# Patient Record
Sex: Female | Born: 1935 | Race: Black or African American | Hispanic: No
Health system: Southern US, Community
[De-identification: ages and names within clinical notes are randomized; demographics above are authoritative.]

---

## 2008-03-09 ENCOUNTER — Inpatient Hospital Stay: Payer: Self-pay | Admitting: Internal Medicine

## 2008-10-13 ENCOUNTER — Ambulatory Visit: Payer: Self-pay

## 2009-08-02 ENCOUNTER — Ambulatory Visit: Payer: Self-pay

## 2009-08-25 ENCOUNTER — Emergency Department: Payer: Self-pay | Admitting: Emergency Medicine

## 2009-09-17 ENCOUNTER — Emergency Department: Payer: Self-pay | Admitting: Emergency Medicine

## 2009-10-05 ENCOUNTER — Ambulatory Visit: Payer: Self-pay | Admitting: Ophthalmology

## 2009-10-12 ENCOUNTER — Ambulatory Visit: Payer: Self-pay | Admitting: Ophthalmology

## 2010-01-07 ENCOUNTER — Ambulatory Visit: Payer: Self-pay | Admitting: Ophthalmology

## 2010-02-26 IMAGING — US US PELV - US TRANSVAGINAL
1 series · 17 of 25 positions shown · non-contrast
Comparison: none

REASON FOR EXAM: Left Adnexal Mass
COMMENTS:

[Series 1: us pelv - us transvaginal · 17 of 34 slices shown]
[im 1/34]
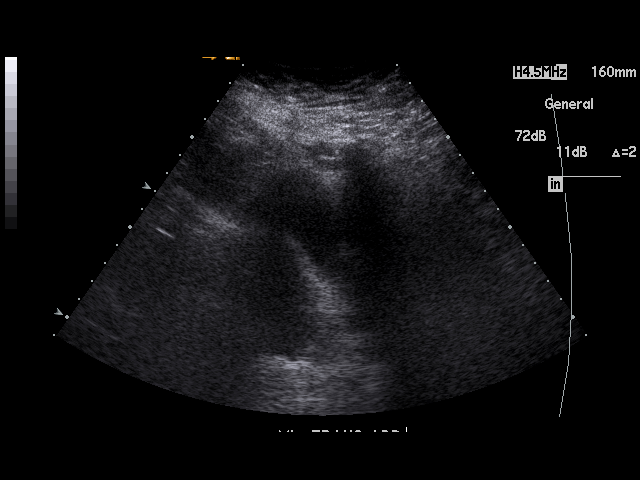
[im 3/34]
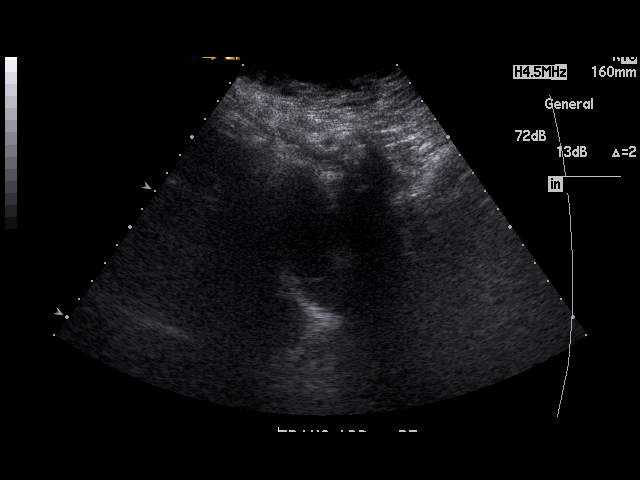
[im 5/34]
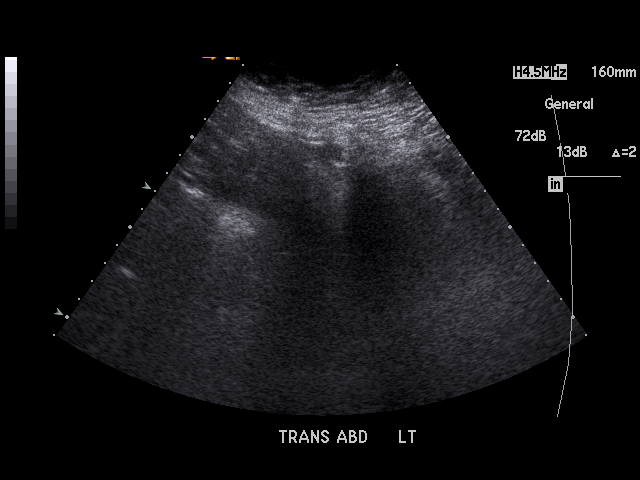
[im 7/34]
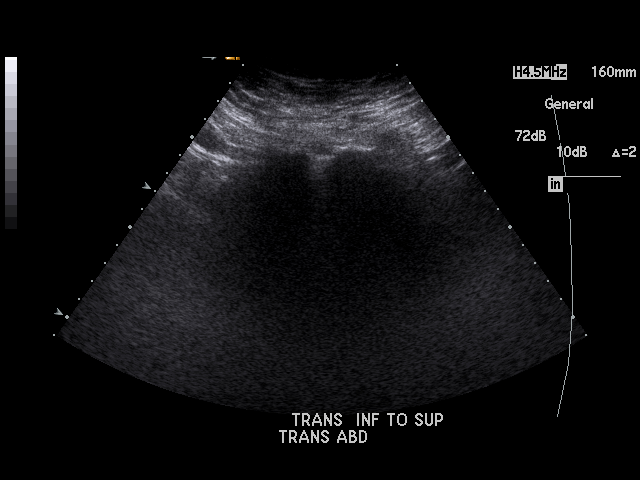
[im 9/34]
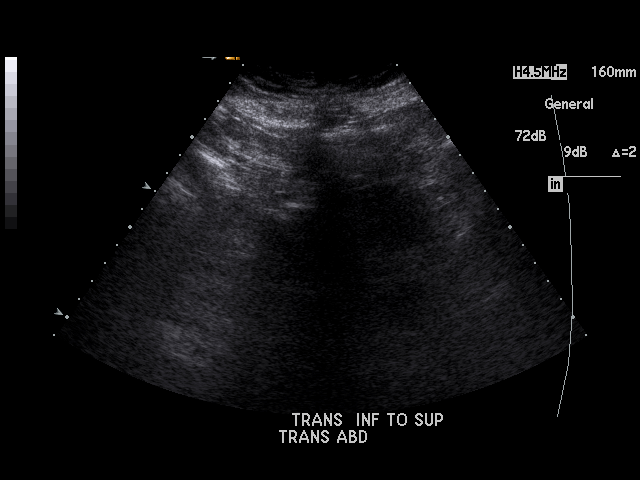
[im 12/34]
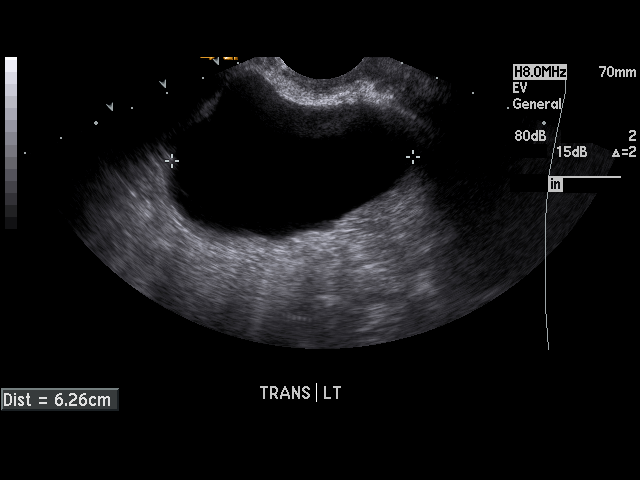
[im 13/34]
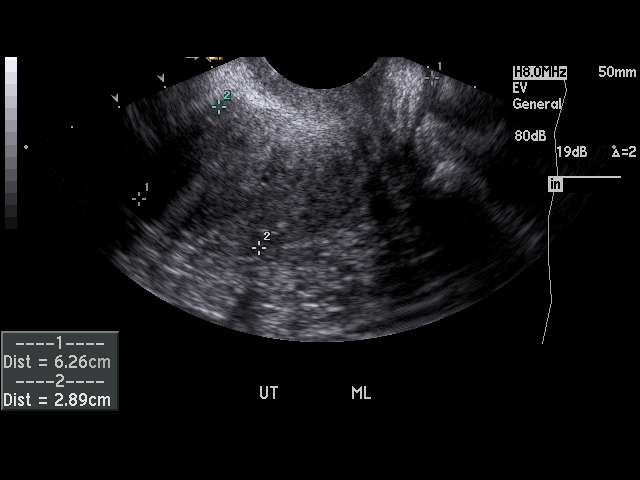
[im 16/34]
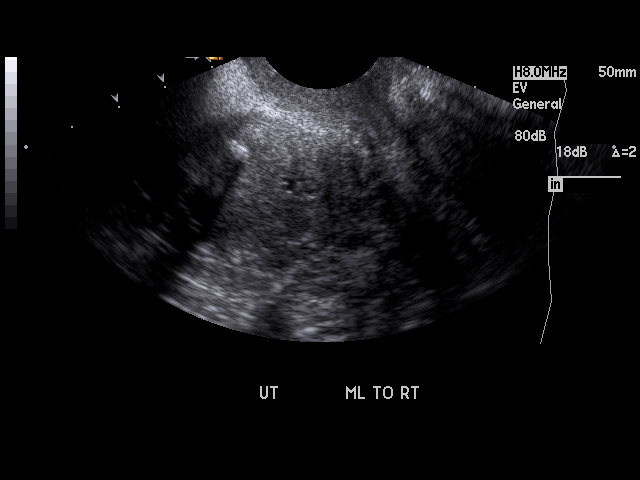
[im 17/34]
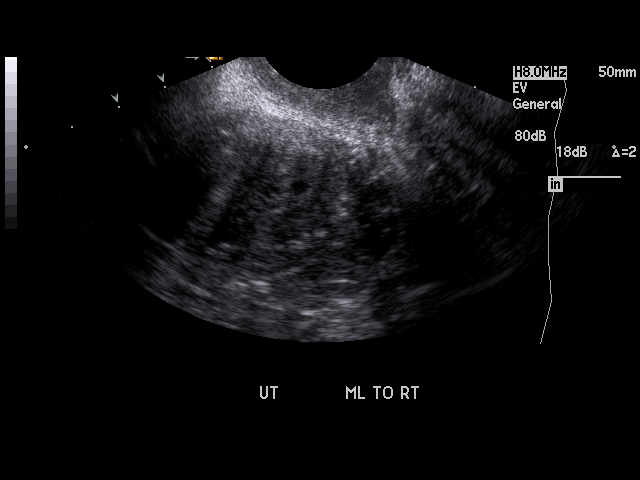
[im 18/34]
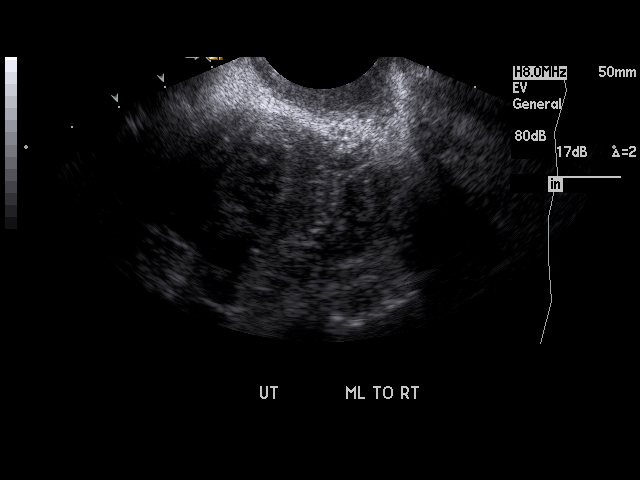
[im 21/34]
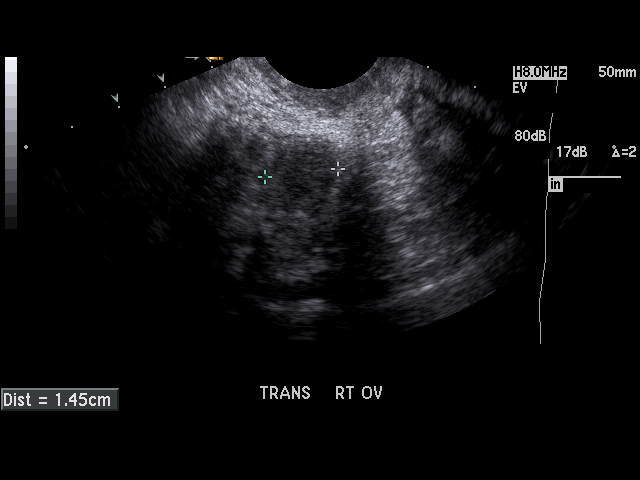
[im 23/34]
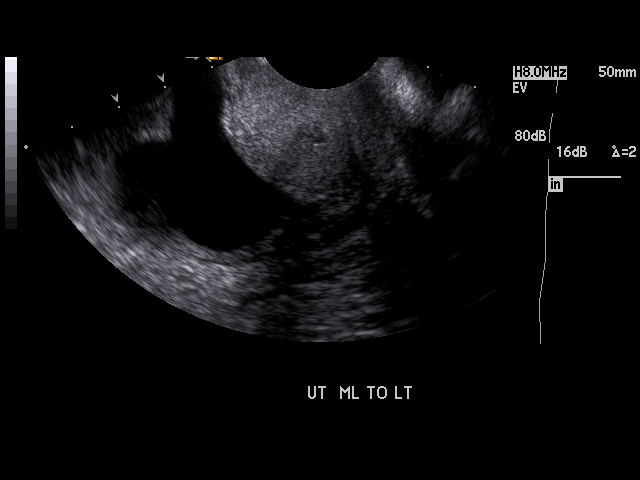
[im 25/34]
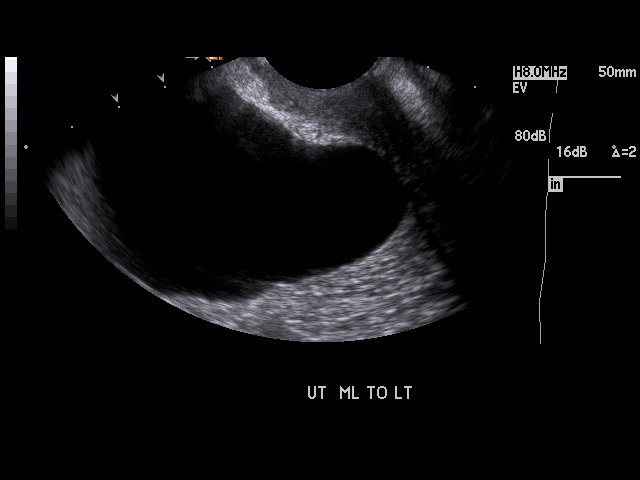
[im 27/34]
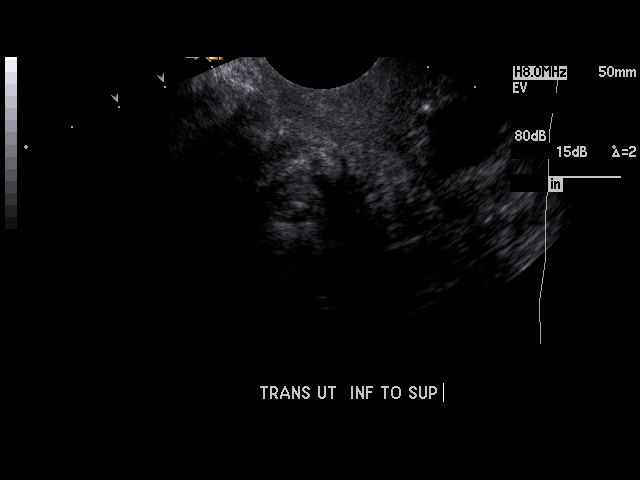
[im 29/34]
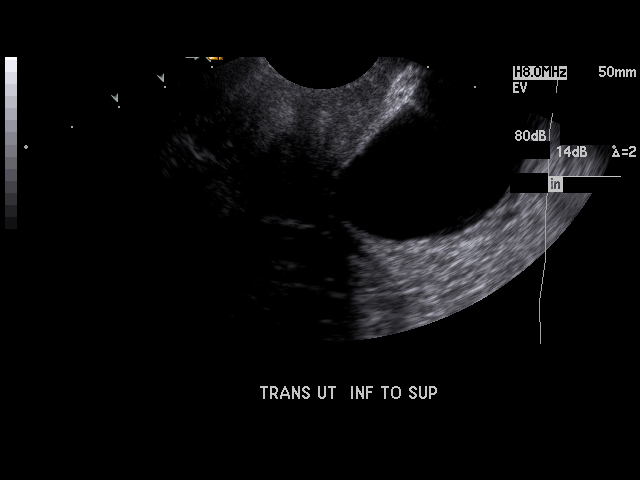
[im 31/34]
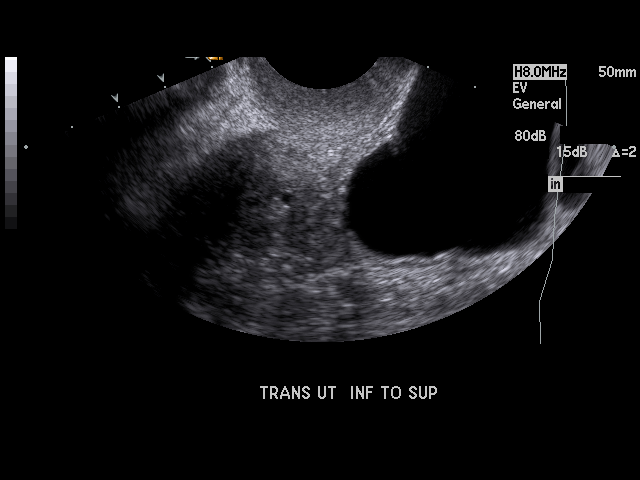
[im 34/34]
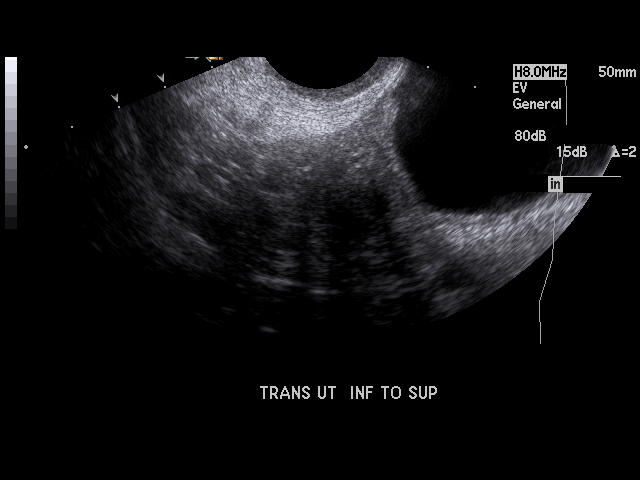

[17 of 25 positions shown; findings below may reference images not displayed]

PROCEDURE:     US  - US PELVIS MASS EXAM W/TRANSVAGI  - March 15, 2008 [DATE]

RESULT:     The uterus is slightly heterogenous with small calcific
densities noted.  Maximum diameter is 6.63 cm (longitudinal diameter).
Fibroid uterus cannot be excluded. The endometrium is normal.  There is
approximately 6.3 cm LEFT adnexal cyst. This is noted on prior CT. The cyst
appears cystic, however, given its large size gynecologic evaluation should
be considered.  The RIGHT ovary is unremarkable.  No free fluid is noted.
IMPRESSION: 6.3 cm LEFT adnexal cyst. This is most likely an ovarian cyst. The LEFT
ovary, however, is not visualized.  The RIGHT ovary is unremarkable.   It
should be noted that the cyst in the LEFT adnexa appears to be simple but
given its large size, close gynecological follow up or direct visualization
should be considered.

## 2010-02-27 IMAGING — CR DG CHEST 1V PORT
1 series · 1 of 1 positions shown · non-contrast
Comparison: none

REASON FOR EXAM: PNEUMONIA/CHF
COMMENTS:

PROCEDURE:     DXR - DXR PORTABLE CHEST SINGLE VIEW  - March 16, 2008  [DATE]
RESULT:     Bilateral patchy pulmonary infiltrates are noted. These have
cleared slightly from 03-10-2008.  The cardiovascular structures are
unremarkable.  Surgical clips are noted in the LEFT upper quadrant.

[view not recorded]
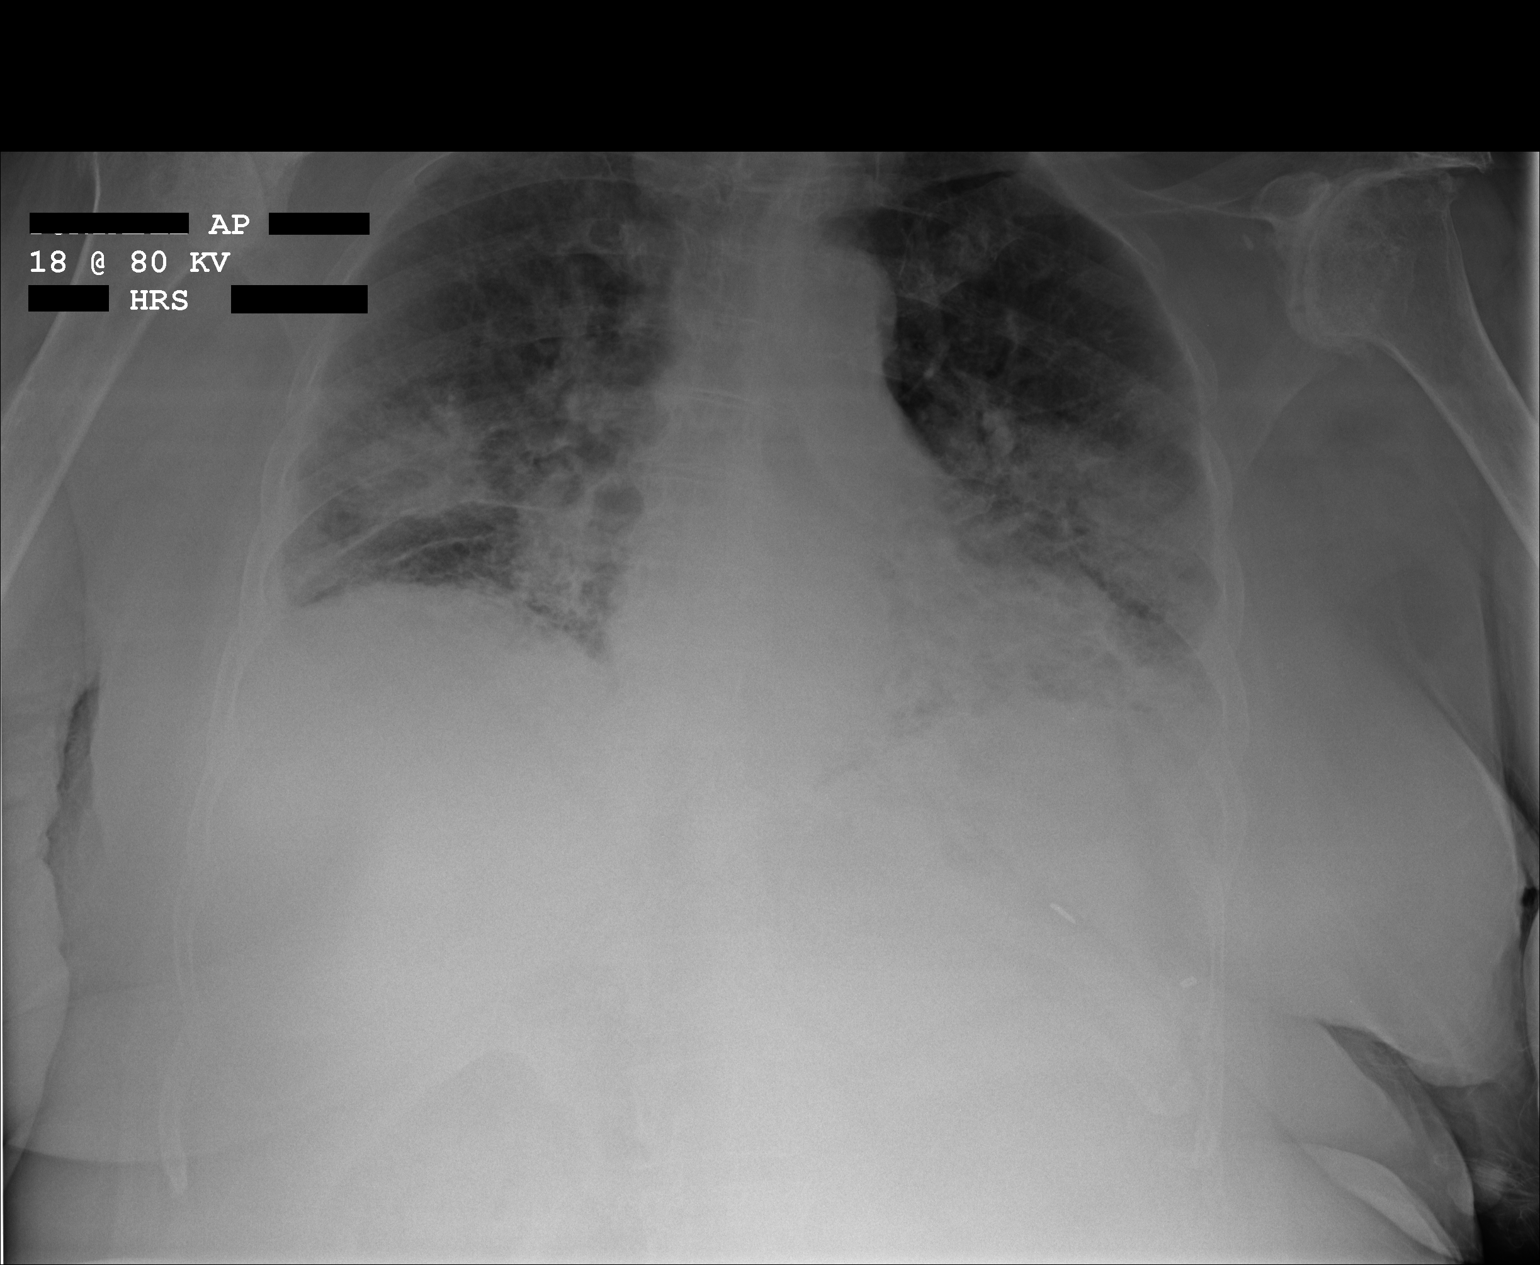

[1 of 1 positions shown; findings below may reference images not displayed]

IMPRESSION: 1. Bilateral pulmonary alveolar infiltrates.  Bilateral pulmonary edema
and/or pneumonia can present in this fashion. There has been slight clearing
from 03-10-2008.

## 2010-06-15 ENCOUNTER — Other Ambulatory Visit: Payer: Self-pay | Admitting: Geriatric Medicine

## 2010-07-11 ENCOUNTER — Other Ambulatory Visit: Payer: Self-pay

## 2010-08-11 ENCOUNTER — Other Ambulatory Visit: Payer: Self-pay | Admitting: Geriatric Medicine

## 2010-08-19 ENCOUNTER — Other Ambulatory Visit: Payer: Self-pay

## 2010-09-08 ENCOUNTER — Ambulatory Visit: Payer: Self-pay | Admitting: Urology

## 2010-09-17 ENCOUNTER — Emergency Department: Payer: Self-pay

## 2010-10-01 ENCOUNTER — Emergency Department: Payer: Self-pay | Admitting: Emergency Medicine

## 2010-10-07 ENCOUNTER — Other Ambulatory Visit: Payer: Self-pay

## 2010-11-06 ENCOUNTER — Emergency Department: Payer: Self-pay | Admitting: *Deleted

## 2011-01-26 ENCOUNTER — Ambulatory Visit: Payer: Self-pay | Admitting: Internal Medicine

## 2011-02-21 ENCOUNTER — Ambulatory Visit: Payer: Self-pay | Admitting: Internal Medicine

## 2011-03-24 ENCOUNTER — Ambulatory Visit: Payer: Self-pay | Admitting: Internal Medicine

## 2011-04-03 ENCOUNTER — Ambulatory Visit: Payer: Self-pay | Admitting: Internal Medicine

## 2011-04-03 LAB — CBC CANCER CENTER
Basophil #: 0 x10 3/mm (ref 0.0–0.1)
Eosinophil #: 0.2 x10 3/mm (ref 0.0–0.7)
Eosinophil %: 1.6 %
HCT: 27.5 % — ABNORMAL LOW (ref 35.0–47.0)
HGB: 9.3 g/dL — ABNORMAL LOW (ref 12.0–16.0)
Lymphocyte #: 1.3 x10 3/mm (ref 1.0–3.6)
Lymphocyte %: 13.2 %
MCV: 95 fL (ref 80–100)
Monocyte %: 3.4 %
Neutrophil %: 81.4 %
Platelet: 194 x10 3/mm (ref 150–440)
RDW: 13.8 % (ref 11.5–14.5)
WBC: 9.8 x10 3/mm (ref 3.6–11.0)

## 2011-04-04 LAB — URINE IEP, RANDOM

## 2011-04-04 LAB — KAPPA/LAMBDA FREE LIGHT CHAINS (ARMC)

## 2011-04-04 LAB — CA 125: CA 125: 99.9 U/mL — ABNORMAL HIGH (ref 0.0–34.0)

## 2011-04-05 ENCOUNTER — Ambulatory Visit: Payer: Self-pay | Admitting: Nephrology

## 2011-04-21 ENCOUNTER — Ambulatory Visit: Payer: Self-pay | Admitting: Internal Medicine

## 2011-04-27 LAB — CBC CANCER CENTER
Basophil #: 0.1 x10 3/mm (ref 0.0–0.1)
Eosinophil #: 0.4 x10 3/mm (ref 0.0–0.7)
Eosinophil %: 4.5 %
HGB: 9.8 g/dL — ABNORMAL LOW (ref 12.0–16.0)
Lymphocyte #: 1.6 x10 3/mm (ref 1.0–3.6)
MCH: 32.2 pg (ref 26.0–34.0)
MCHC: 33.7 g/dL (ref 32.0–36.0)
MCV: 96 fL (ref 80–100)
Monocyte #: 0.6 x10 3/mm (ref 0.0–0.7)
Neutrophil %: 72.5 %
Platelet: 198 x10 3/mm (ref 150–440)
RBC: 3.04 10*6/uL — ABNORMAL LOW (ref 3.80–5.20)
RDW: 14.1 % (ref 11.5–14.5)

## 2011-04-27 LAB — CREATININE, SERUM
Creatinine: 3.09 mg/dL — ABNORMAL HIGH (ref 0.60–1.30)
EGFR (African American): 19 — ABNORMAL LOW
EGFR (Non-African Amer.): 15 — ABNORMAL LOW

## 2011-04-28 LAB — CA 125: CA 125: 106.3 U/mL — ABNORMAL HIGH (ref 0.0–34.0)

## 2011-05-22 ENCOUNTER — Ambulatory Visit: Payer: Self-pay | Admitting: Internal Medicine

## 2011-08-28 ENCOUNTER — Ambulatory Visit: Payer: Self-pay | Admitting: Internal Medicine

## 2011-08-28 LAB — URINALYSIS, COMPLETE: Squamous Epithelial: NONE SEEN

## 2011-08-29 LAB — CBC WITH DIFFERENTIAL/PLATELET
Basophil #: 0 10*3/uL (ref 0.0–0.1)
Basophil %: 0.1 %
HCT: 26.5 % — ABNORMAL LOW (ref 35.0–47.0)
HGB: 8.9 g/dL — ABNORMAL LOW (ref 12.0–16.0)
Lymphocyte %: 3.9 %
MCV: 95 fL (ref 80–100)
Monocyte %: 6.5 %
Platelet: 184 10*3/uL (ref 150–440)
RBC: 2.79 10*6/uL — ABNORMAL LOW (ref 3.80–5.20)
RDW: 14.9 % — ABNORMAL HIGH (ref 11.5–14.5)
WBC: 27.5 10*3/uL — ABNORMAL HIGH (ref 3.6–11.0)

## 2011-08-29 LAB — COMPREHENSIVE METABOLIC PANEL
Albumin: 2.7 g/dL — ABNORMAL LOW (ref 3.4–5.0)
Alkaline Phosphatase: 75 U/L (ref 50–136)
Anion Gap: 14 (ref 7–16)
BUN: 72 mg/dL — ABNORMAL HIGH (ref 7–18)
Bilirubin,Total: 0.3 mg/dL (ref 0.2–1.0)
Calcium, Total: 7.7 mg/dL — ABNORMAL LOW (ref 8.5–10.1)
Chloride: 109 mmol/L — ABNORMAL HIGH (ref 98–107)
Creatinine: 5.35 mg/dL — ABNORMAL HIGH (ref 0.60–1.30)
EGFR (African American): 8 — ABNORMAL LOW
Glucose: 84 mg/dL (ref 65–99)
Osmolality: 302 (ref 275–301)
Potassium: 3.7 mmol/L (ref 3.5–5.1)
Sodium: 141 mmol/L (ref 136–145)
Total Protein: 6.8 g/dL (ref 6.4–8.2)

## 2011-08-29 LAB — CK TOTAL AND CKMB (NOT AT ARMC)
CK, Total: 64 U/L (ref 21–215)
CK-MB: 0.6 ng/mL (ref 0.5–3.6)

## 2011-08-29 LAB — TROPONIN I: Troponin-I: <0.02 ng/mL

## 2011-08-30 ENCOUNTER — Inpatient Hospital Stay: Payer: Self-pay | Admitting: Internal Medicine

## 2011-08-30 LAB — URINE CULTURE

## 2011-08-31 LAB — CBC WITH DIFFERENTIAL/PLATELET
Basophil #: 0 10*3/uL (ref 0.0–0.1)
Basophil %: 0.1 %
Eosinophil %: 0.1 %
HGB: 8 g/dL — ABNORMAL LOW (ref 12.0–16.0)
Lymphocyte %: 3.5 %
Monocyte #: 1.6 x10 3/mm — ABNORMAL HIGH (ref 0.2–0.9)
Neutrophil %: 88.4 %
RBC: 2.53 10*6/uL — ABNORMAL LOW (ref 3.80–5.20)

## 2011-08-31 LAB — COMPREHENSIVE METABOLIC PANEL
Albumin: 2.2 g/dL — ABNORMAL LOW (ref 3.4–5.0)
Anion Gap: 12 (ref 7–16)
BUN: 70 mg/dL — ABNORMAL HIGH (ref 7–18)
Bilirubin,Total: 0.2 mg/dL (ref 0.2–1.0)
Calcium, Total: 7.3 mg/dL — ABNORMAL LOW (ref 8.5–10.1)
Creatinine: 5.23 mg/dL — ABNORMAL HIGH (ref 0.60–1.30)
Glucose: 93 mg/dL (ref 65–99)
Osmolality: 307 (ref 275–301)
Potassium: 3.4 mmol/L — ABNORMAL LOW (ref 3.5–5.1)
Sodium: 144 mmol/L (ref 136–145)
Total Protein: 5.6 g/dL — ABNORMAL LOW (ref 6.4–8.2)

## 2011-09-01 LAB — BASIC METABOLIC PANEL
BUN: 65 mg/dL — ABNORMAL HIGH (ref 7–18)
Creatinine: 4.43 mg/dL — ABNORMAL HIGH (ref 0.60–1.30)
EGFR (African American): 10 — ABNORMAL LOW
EGFR (Non-African Amer.): 9 — ABNORMAL LOW
Glucose: 87 mg/dL (ref 65–99)
Osmolality: 307 (ref 275–301)
Sodium: 145 mmol/L (ref 136–145)

## 2011-09-01 LAB — CBC WITH DIFFERENTIAL/PLATELET
Eosinophil #: 0 10*3/uL (ref 0.0–0.7)
Eosinophil %: 0.2 %
MCH: 31.9 pg (ref 26.0–34.0)
MCHC: 33.7 g/dL (ref 32.0–36.0)
Monocyte #: 1.3 x10 3/mm — ABNORMAL HIGH (ref 0.2–0.9)
Monocyte %: 8.7 %
Neutrophil %: 86 %
Platelet: 146 10*3/uL — ABNORMAL LOW (ref 150–440)
RBC: 2.43 10*6/uL — ABNORMAL LOW (ref 3.80–5.20)
WBC: 14.8 10*3/uL — ABNORMAL HIGH (ref 3.6–11.0)

## 2011-09-01 LAB — URINALYSIS, COMPLETE
Bacteria: NONE SEEN
Specific Gravity: 1.013 (ref 1.003–1.030)
Squamous Epithelial: 5
WBC UR: 568 /HPF (ref 0–5)

## 2011-09-02 LAB — CBC WITH DIFFERENTIAL/PLATELET
Basophil %: 0.2 %
Eosinophil #: 0.1 10*3/uL (ref 0.0–0.7)
HCT: 23 % — ABNORMAL LOW (ref 35.0–47.0)
HGB: 7.5 g/dL — ABNORMAL LOW (ref 12.0–16.0)
Lymphocyte %: 6.1 %
MCV: 96 fL (ref 80–100)
Monocyte #: 1.6 x10 3/mm — ABNORMAL HIGH (ref 0.2–0.9)
Monocyte %: 10.1 %
Neutrophil #: 12.8 10*3/uL — ABNORMAL HIGH (ref 1.4–6.5)
Platelet: 153 10*3/uL (ref 150–440)
RBC: 2.4 10*6/uL — ABNORMAL LOW (ref 3.80–5.20)
RDW: 15.2 % — ABNORMAL HIGH (ref 11.5–14.5)
WBC: 15.4 10*3/uL — ABNORMAL HIGH (ref 3.6–11.0)

## 2011-09-02 LAB — BASIC METABOLIC PANEL
Anion Gap: 11 (ref 7–16)
BUN: 56 mg/dL — ABNORMAL HIGH (ref 7–18)
Calcium, Total: 8.2 mg/dL — ABNORMAL LOW (ref 8.5–10.1)
Chloride: 117 mmol/L — ABNORMAL HIGH (ref 98–107)
Co2: 19 mmol/L — ABNORMAL LOW (ref 21–32)
Creatinine: 3.67 mg/dL — ABNORMAL HIGH (ref 0.60–1.30)
EGFR (African American): 13 — ABNORMAL LOW
Potassium: 3.4 mmol/L — ABNORMAL LOW (ref 3.5–5.1)
Sodium: 147 mmol/L — ABNORMAL HIGH (ref 136–145)

## 2011-09-02 LAB — URINE CULTURE

## 2011-09-05 LAB — CULTURE, BLOOD (SINGLE)

## 2011-10-04 ENCOUNTER — Emergency Department: Payer: Self-pay | Admitting: Unknown Physician Specialty

## 2011-11-21 DEATH — deceased

## 2013-08-11 IMAGING — CR DG CHEST 1V PORT
1 series · 1 of 1 positions shown · non-contrast
Comparison: none

REASON FOR EXAM: weakness
COMMENTS:

PROCEDURE:     DXR - DXR PORTABLE CHEST SINGLE VIEW  - August 29, 2011 [DATE]
RESULT:     Comparison: 08/02/2009

[portable]
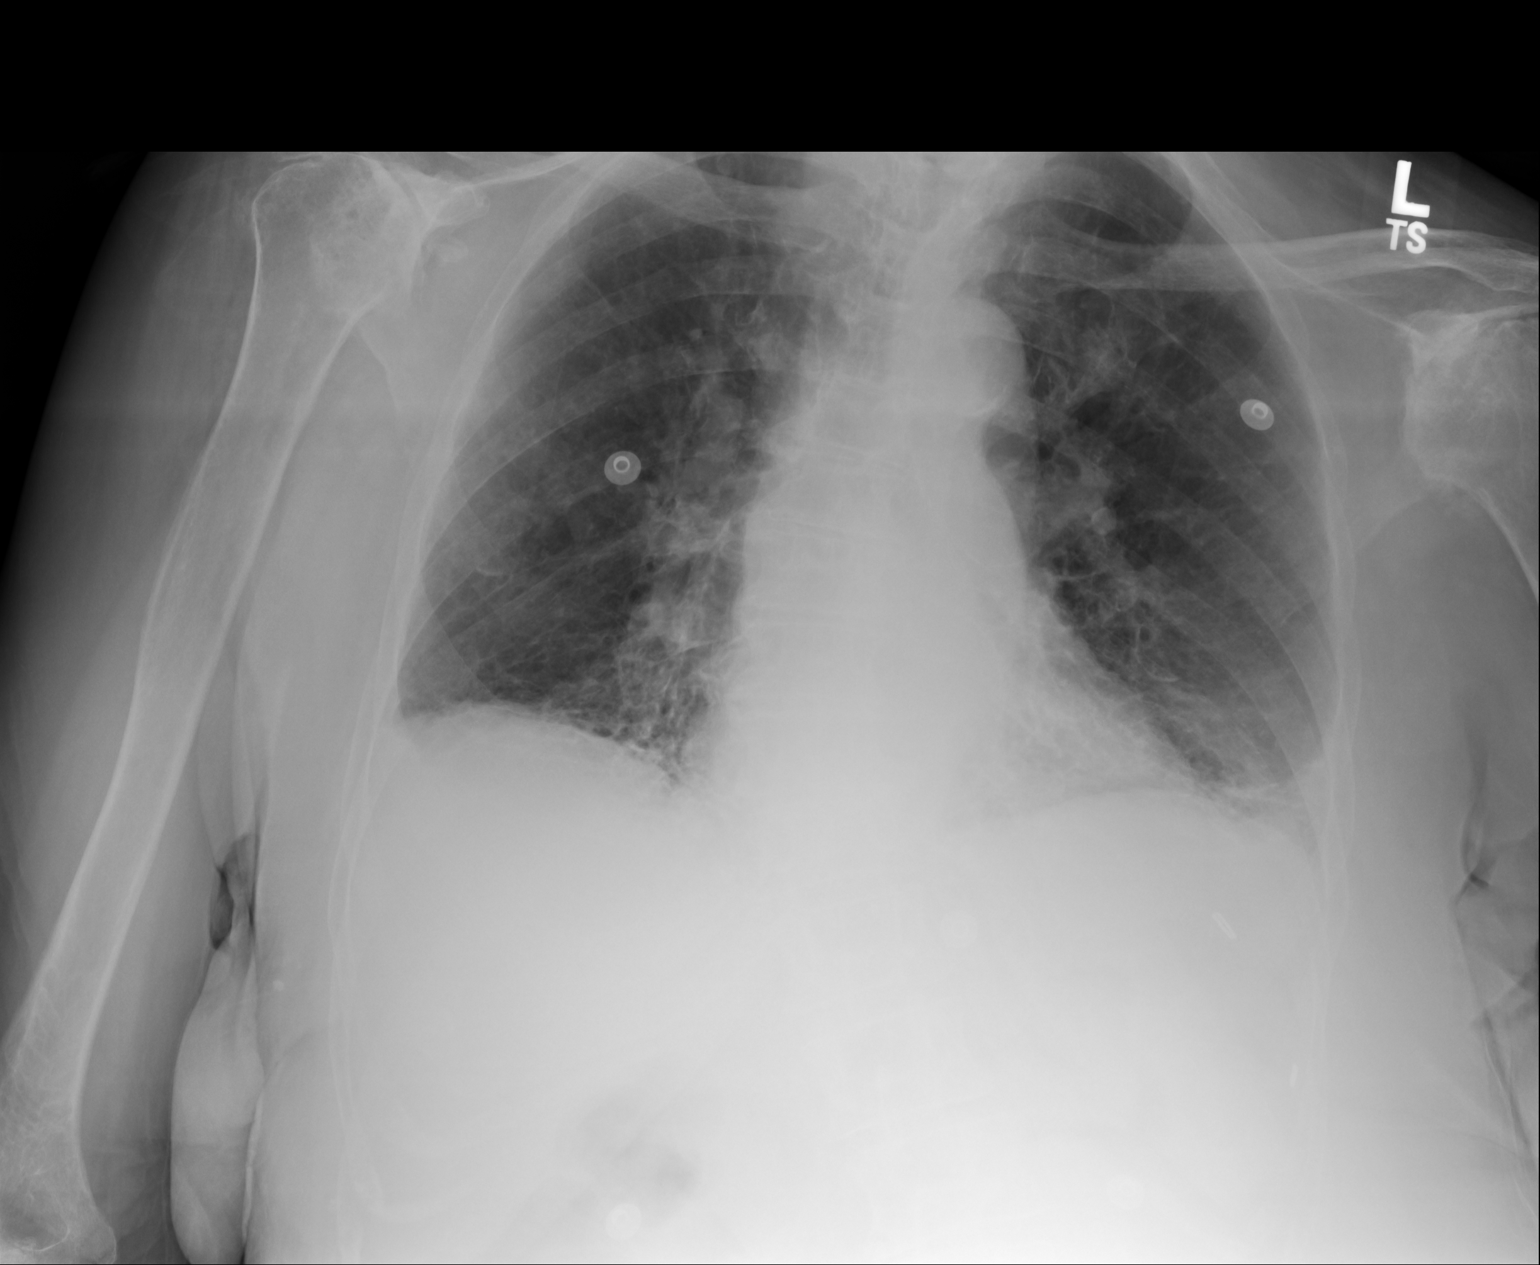

[1 of 1 positions shown; findings below may reference images not displayed]

FINDINGS: The heart and mediastinum are stable. Coarse reticular opacities at the lung
bases are similar to prior and may related to changes of fibrosis. Small
density overlying the left upper lung adjacent to the aortic arch is felt to
be secondary to the overlying first costochondral junction. There are
degenerative changes of the shoulders, left greater than right.
IMPRESSION: 1. Findings likely secondary to changes of fibrosis, similar to prior.
2. Small nodular density overlying the left upper lung is likely artifactual
relating to the overlying first costochondral junction. However, followup PA
and lateral chest radiographs are recommended to ensure stability/resolution.

## 2013-09-16 IMAGING — CR PELVIS - 1-2 VIEW
1 series · 1 of 1 positions shown · non-contrast
Comparison: none

REASON FOR EXAM: trauma, pain
COMMENTS:

PROCEDURE:     DXR - DXR PELVIS AP ONLY  - October 04, 2011  [DATE]
RESULT:
The bones are osteopenia. There is no evidence of acute fracture or
dislocation.

[ap]
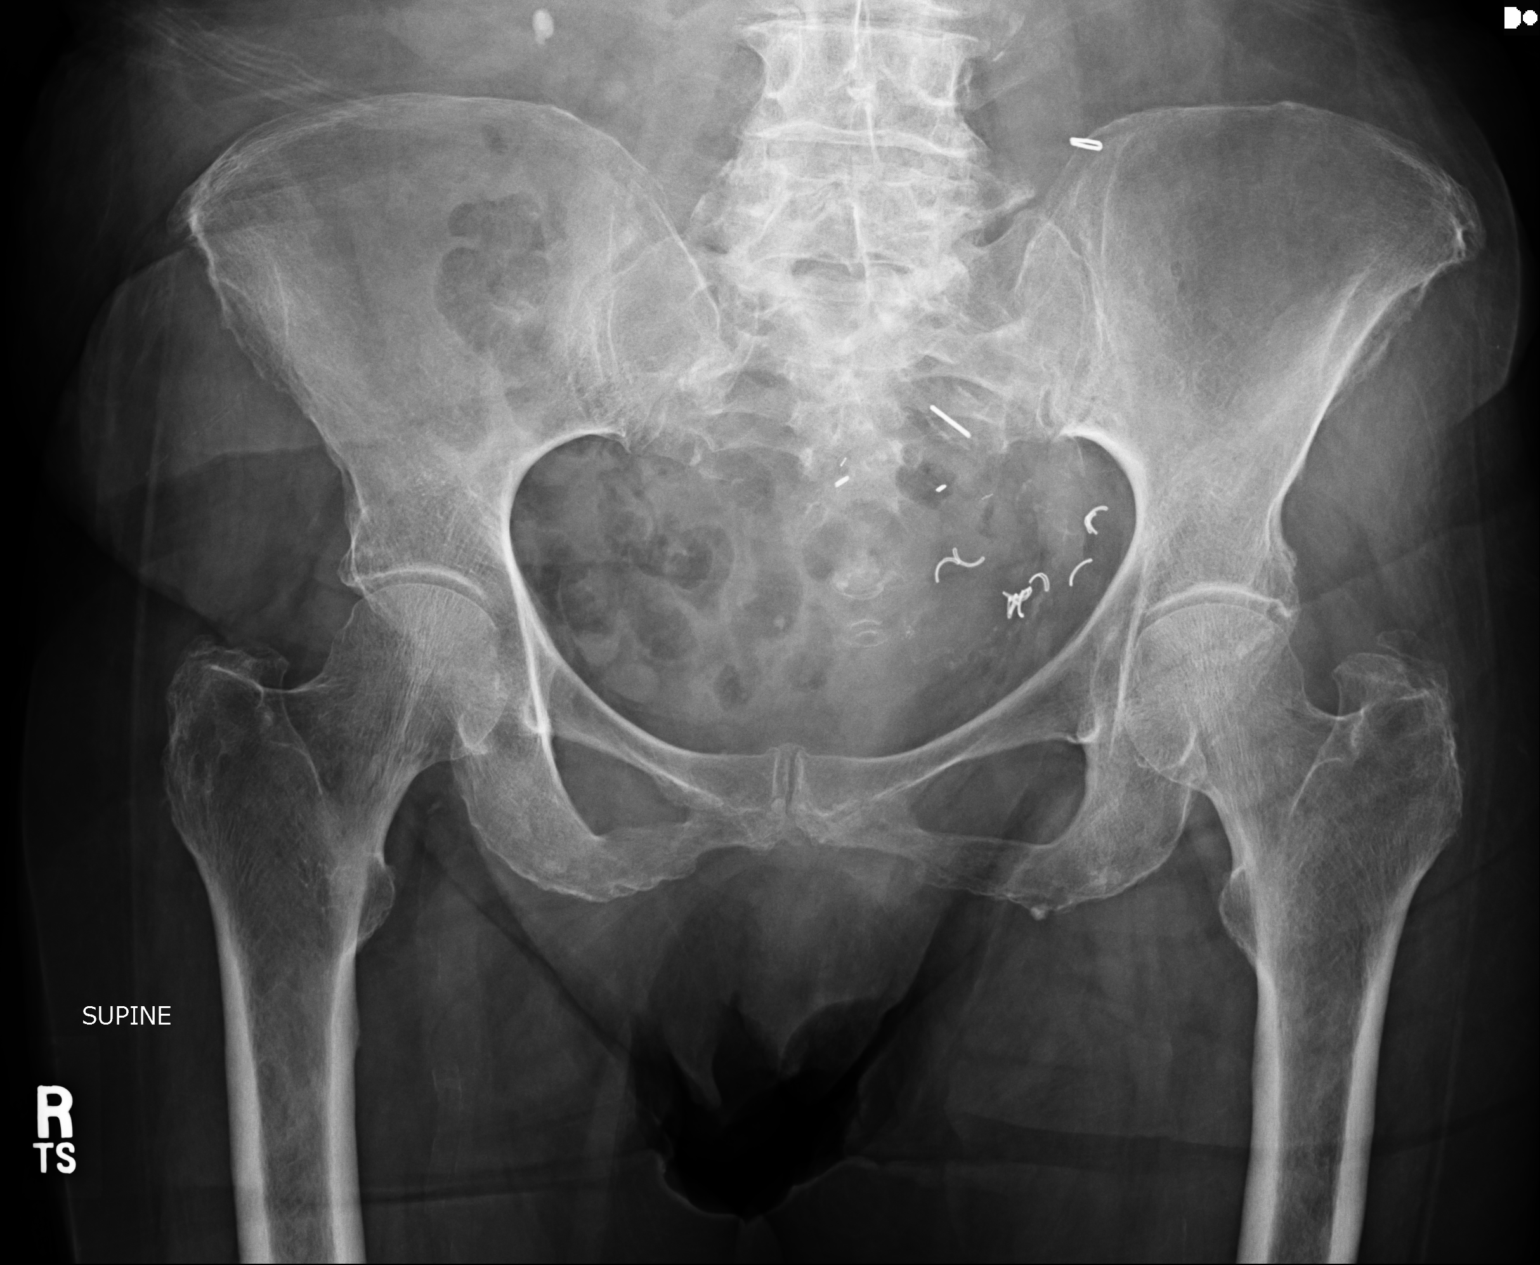

[1 of 1 positions shown; findings below may reference images not displayed]

IMPRESSION: No evidence of acute osseous abnormalities.

## 2013-09-16 IMAGING — CR DG KNEE COMPLETE 4+V*R*
1 series · 4 of 4 positions shown · non-contrast
Comparison: none

REASON FOR EXAM: pain, trauma
COMMENTS:

PROCEDURE:     DXR - DXR KNEE RT COMP WITH OBLIQUES  - October 04, 2011  [DATE]
RESULT:
The patient is status post total right knee replacement. The hardware
appears intact without evidence of loosening or failure. The native osseous
structures demonstrate no evidence of fracture or dislocation.

[Series 1: ap · 0.17mm/px · 4 of 4 slices shown]
[im 1/4]
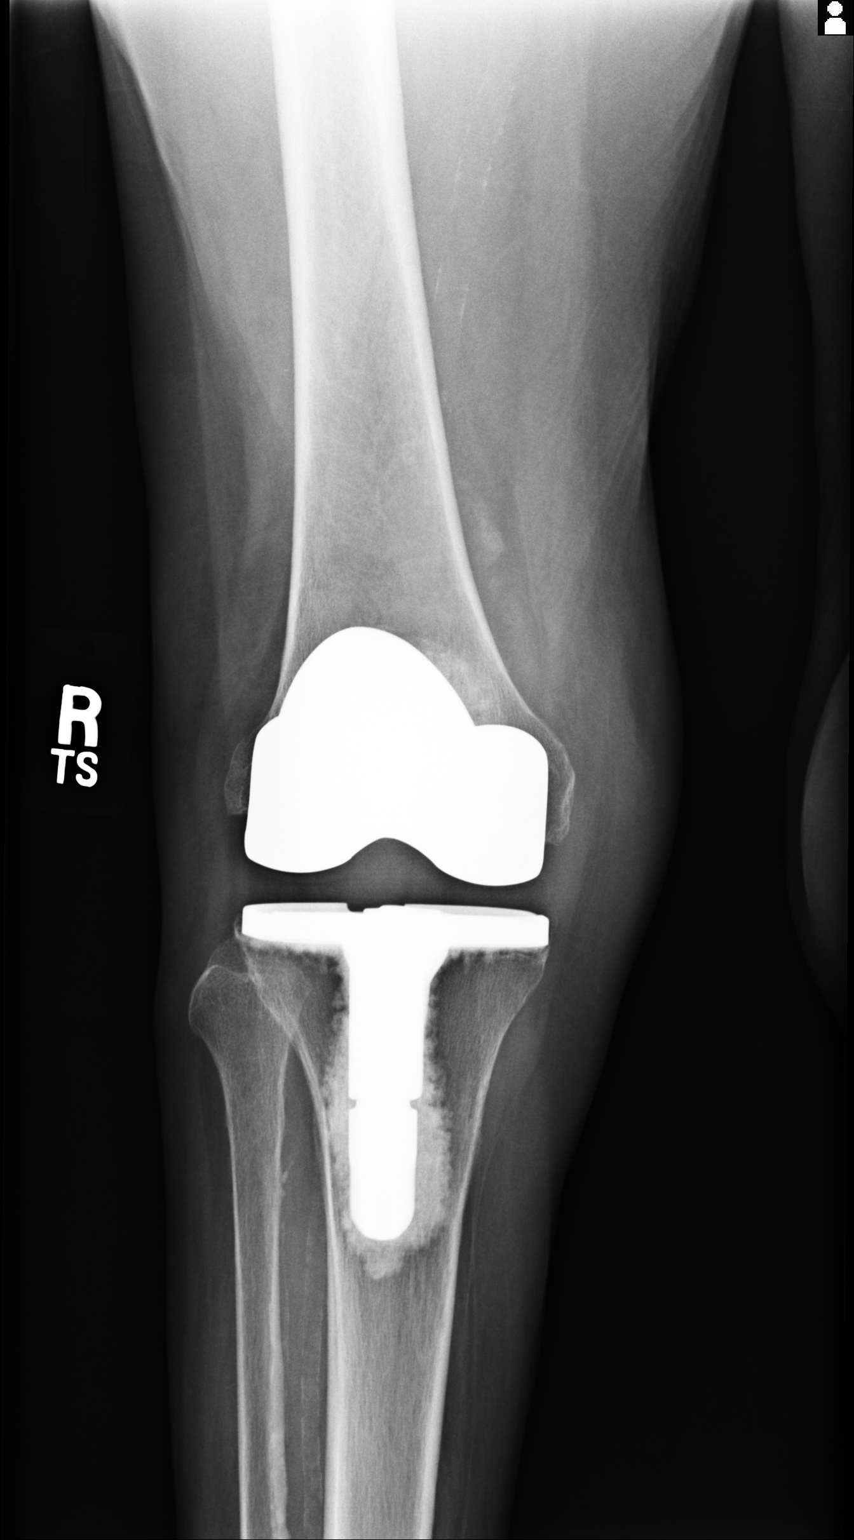
[im 2/4]
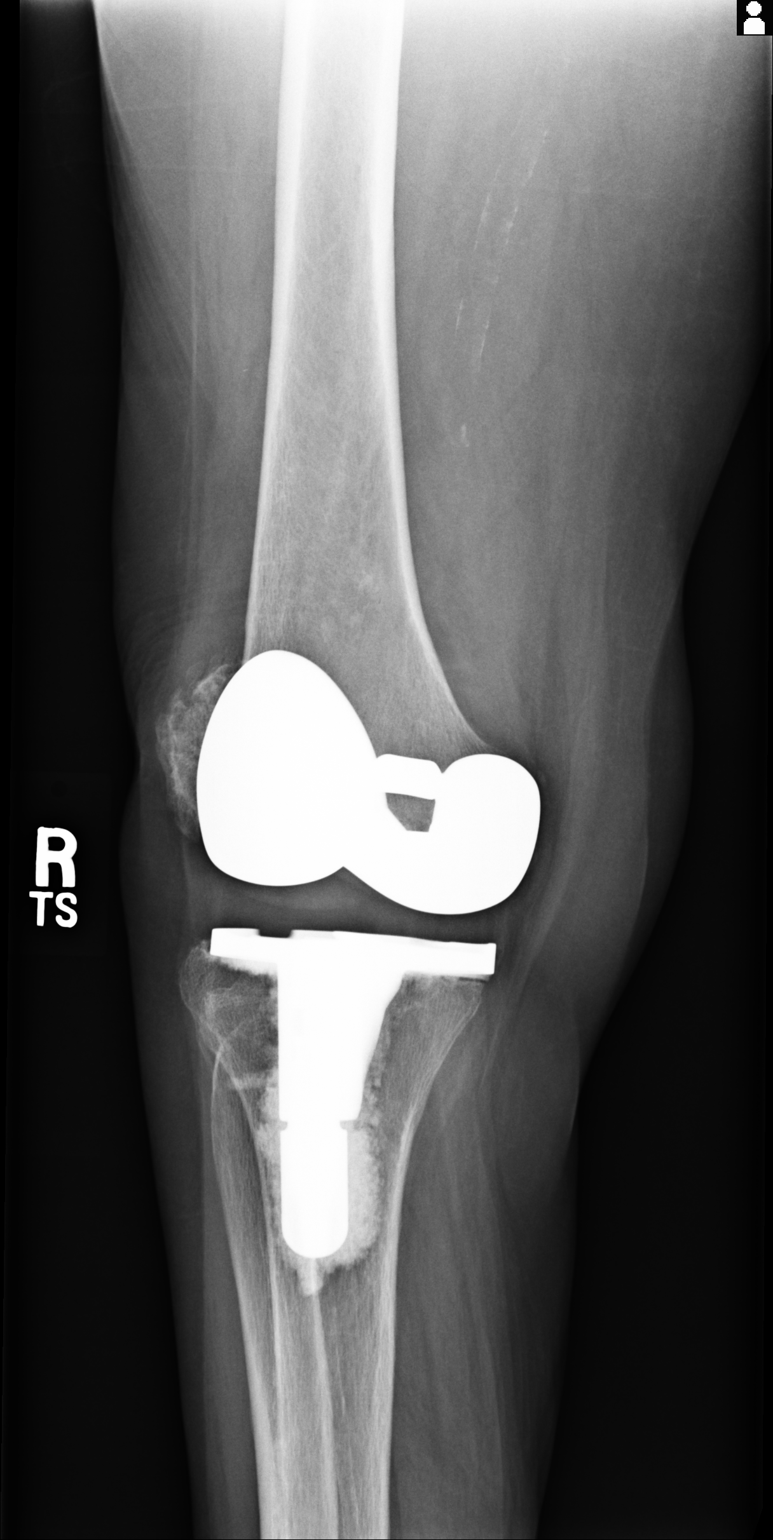
[im 3/4]
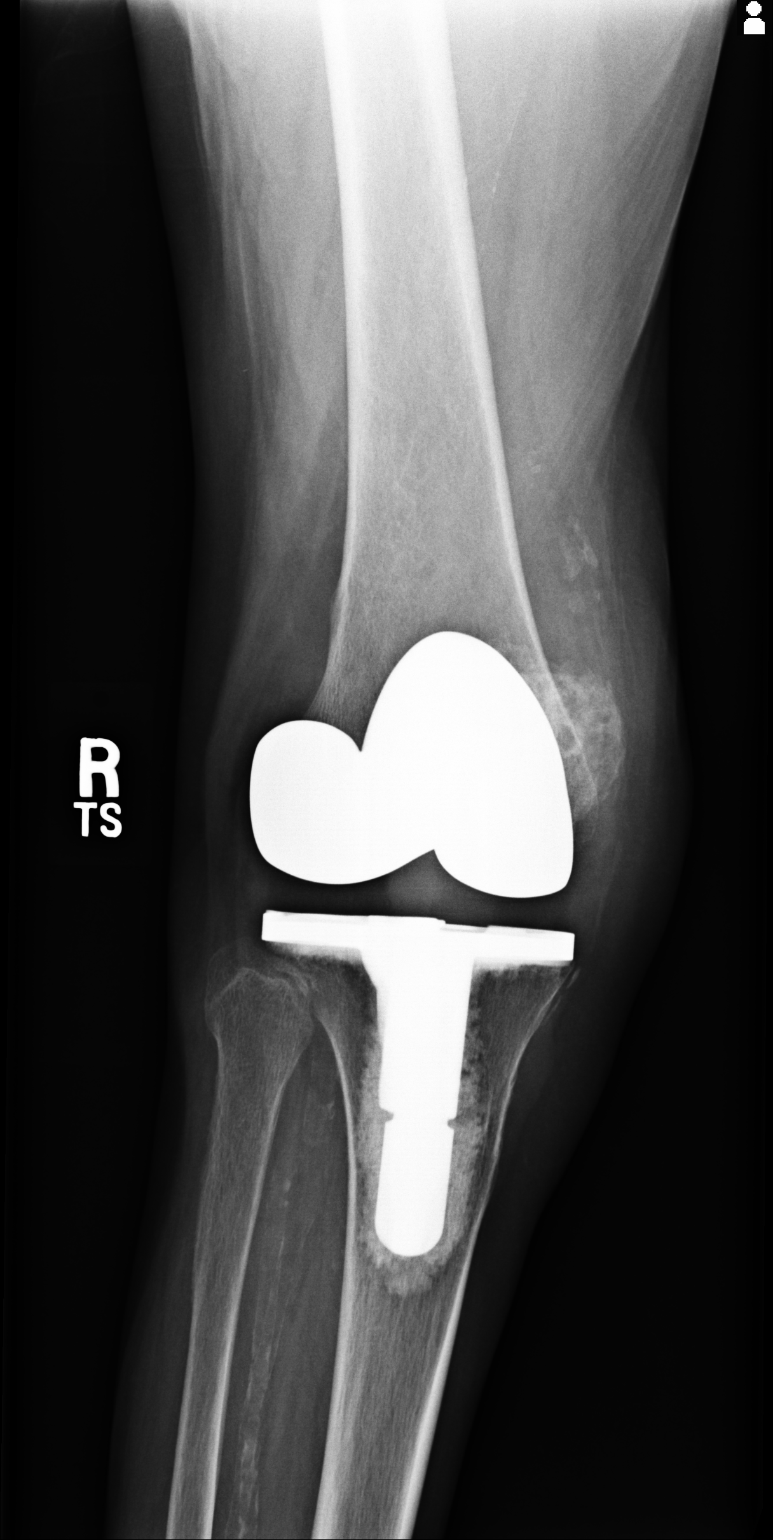
[im 4/4]
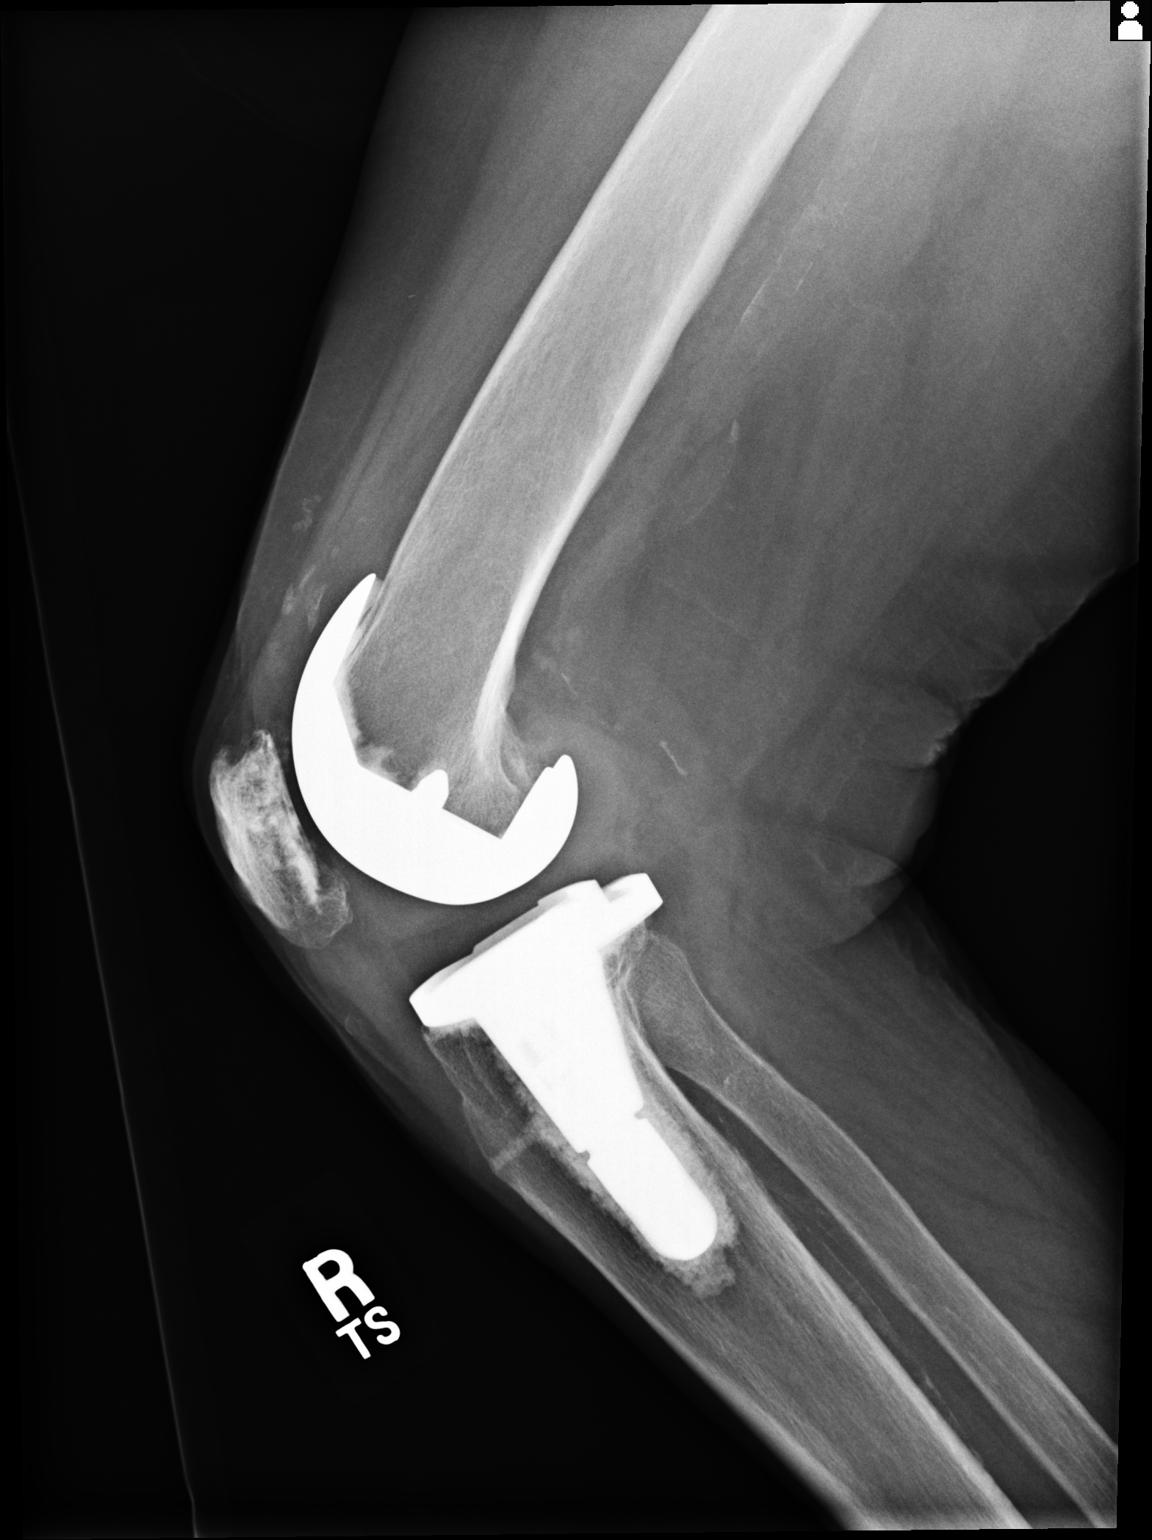

[4 of 4 positions shown; findings below may reference images not displayed]

IMPRESSION: No evidence of acute or focal abnormalities.

## 2014-06-14 NOTE — H&P (Signed)
PATIENT NAME:  Brianna White, Brianna White MR#:  [id number] DATE OF BIRTH:  [birth year]  DATE OF ADMISSION:  08/30/2011  REFERRING PHYSICIAN: Dr. Manson Passey    PRIMARY CARE PHYSICIAN: None. Previously resident of [facility] [address] She has returned home and lives with granddaughter now and is being followed by Hospice but does not have a PCP yet.   PRESENTING COMPLAINT: Altered mental status from baseline and slurred speech.   HISTORY OF PRESENT ILLNESS: Brianna White is a pleasant 81 year old woman with history of dementia, hypertension, chronic kidney disease, osteoporosis, osteoarthritis, history of presumed ovarian cancer who presents with reports of increased confusion today. Her granddaughter is present in the room. Apparently three days ago she developed complaints of right back and right side pain with gross hematuria and nausea and vomiting. The patient was seen in Urgent Care on 08/28/2011 and was found to be slightly hypotensive and dirty urine with gross hematuria. She was started on Bactrim and instructed to decrease hydralazine and hold Lasix, however, was able to get a dose of Bactrim in yesterday but today was not able to swallow her pill and had a recurrent episode of vomiting and difficulty swallowing her other pills as well with increased confusion and slurred speech. No fevers or chills. She has chronic headache otherwise. Her granddaughter reports no other complaints from her baseline.   PAST MEDICAL HISTORY:  1. Adnexal cystic mass first noted in July 2012 with elevated CA-125 level presumed to be ovarian cancer which family has preferred not to pursue aggressive intervention and diagnostics.  2. Renal masses thought to be complex cyst.  3. Chronic kidney disease with baseline creatinine around 3.  4. History of borderline leukocytosis.  5. Chronic anemia, likely of chronic kidney disease.  6. Hypertension.  7. Dementia.  8. Osteoporosis.  9. Osteoarthritis.   10. Nephrolithiasis. 11. Congestive heart failure, likely diastolic dysfunction. Echocardiogram from 2010 with EF of 55%, LVH, and moderate pulmonic regurgitation.   PAST SURGICAL HISTORY:  1. Bilateral knee replacements.  2. Colostomy for benign mass.  3. Left cataract surgery.   ALLERGIES: No known drug allergies.   MEDICATIONS:  1. Aspirin 81 mg daily.  2. Atenolol 25 mg daily.  3. Ativan 0.5 mg every four hours as needed.  4. Ferrous sulfate 324 mg daily.  5. Hydralazine 100 mg b.i.d., which has been decreased. 6. Lasix 20 mg daily, which is being held. 7. Losartan 50 mg daily. 8. Prednisone 3 mg daily which has been titrated down from Urgent Care, was previously on 4 mg daily, unknown why.  9. Senna Plus 2 tablets at bedtime.  10. Bactrim double-strength b.i.d. for seven days starting 08/28/2011. She has only gotten one dose in.  11. Tylenol 650 mg b.i.d. as needed.  12. Vitamin D3 1000 international units daily.  13. Multivitamin daily   FAMILY HISTORY: Brothers with cancer.   SOCIAL HISTORY: She lives with her granddaughter. Was previously at [facility] but currently without a PCP and being followed by Hospice. No alcohol. She quit tobacco in 1970's.   REVIEW OF SYSTEMS: As per history of present illness, otherwise unreliable due to her baseline dementia. She is currently back at her baseline mentation. Even reports now that she is not feeling pain with her back and side.     PHYSICAL EXAMINATION:   VITAL SIGNS: Temperature 99.1, pulse 65, respiratory rate 20, blood pressure 99/58, sating at 97% on room air   GENERAL: Lying in bed in no apparent  distress.   HEENT: Normocephalic, atraumatic. Pupils equal, symmetric, nonicteric. Nares without discharge. Moist mucous membrane.   NECK: Soft and supple. No adenopathy or JVP.   CARDIOVASCULAR: Non-tachy. No murmurs, rubs, or gallops.   LUNGS: Basilar crackles. No use of accessory muscles or increased  respiratory effort.   ABDOMEN: Soft. Positive bowel sounds. She has got some mild tenderness on the right CVA.   MUSCULOSKELETAL: Knee and hand deformity.   EXTREMITIES: No edema. Dorsal pedis pulses intact.   NEUROLOGIC: No dysarthria or slurred speech. She has symmetrical squeeze and strength. No focal deficits.   PSYCH: She is alert but not oriented.   PERTINENT LABS AND STUDIES: Glucose 84, BUN 72, creatinine 5.35, sodium 141, potassium 3.7, chloride 109, carbon dioxide 18, calcium 7.7, total bilirubin 0.3, alkaline phosphatase 75, ALT 11, AST 20, total protein 6.8, albumin 2.7. CK 64. MB 0.6. Troponin less than 0.02. WBC 27.5, hemoglobin 8.9, hematocrit 26.5, platelets 184, MCV 95. Urinalysis from 07/08 with gross hematuria that was too numerous count. There is 5 to 15 WBCs and 4+ bacteria. Urine culture is greater than 100,000 gram-negative rods.   Chest x-ray with basilar atelectasis and some congestion. Official chest x-ray read is pending.   ASSESSMENT AND PLAN: Brianna White is an 81 year old woman with history of dementia, presumed ovarian cancer, chronic anemia, CHF, osteoarthritis, osteoporosis, hypertension, chronic kidney disease, presumed complex renal cyst, nephrolithiasis presenting with slurred speech, altered mental status from baseline, nausea, vomiting, right side and back pain and hematuria.  1. Encephalopathy/delirium with history of baseline dementia now back at her baseline in the setting of pyelonephritis and sepsis. CT is pending, but low suspicion for CVA or events given now that patient is back at baseline with antibiotics and IV fluids. Was seen in Urgent Care as above with discontinuation of Lasix and decreased hydralazine and only took one dose of Bactrim. She remains hypotensive. Will hold hydralazine, atenolol, and Lasix. Her urine cultures were positive for gram-negative rod. Will continue on ceftriaxone and stop the Bactrim. She has history of CHF, therefore will  monitor with fluid repletion and holding her Lasix. Follow I's and O's, daily weights. She has basilar crackles on exam and chest x-ray results as dictated above.  2. Acute on chronic renal failure likely in the setting of pyelonephritis and mild dehydration holding her Lasix and losartan. IV fluids and follow daily creatinine.  3. Acute on chronic anemia. Her hemoglobin is slightly down from her baseline likely in the setting of the hematuria. Will hold aspirin. Follow hemoglobin and hematocrit.  4. On prednisone for unknown reason. Continue with titration off, apparently initiated when she was a resident at [facility]. Her granddaughter will get information and why it was initiated.  5. Dysphasia, difficulty with swallowing pills. Will obtain a speech evaluation.  6. Prophylaxis with Protonix, TEDs, and SCDs.   TIME SPENT: Approximately 50 minutes spent on patient care.   ____________________________ Reuel DerbyAlounthith Nicklous Aburto, MD ap:drc D: 08/30/2011 00:59:38 ET T: 08/30/2011 07:07:12 ET JOB#: [id number]  cc: Pearlean BrownieAlounthith Wolf Boulay, MD, <Dictator> Reuel DerbyALOUNTHITH Yobani Schertzer MD ELECTRONICALLY SIGNED 09/09/2011 0:37

## 2014-06-14 NOTE — Discharge Summary (Signed)
PATIENT NAME:  Brianna White, Brianna White MR#:  [id number] DATE OF BIRTH:  [birth year]  DATE OF ADMISSION:  08/30/2011 DATE OF DISCHARGE:  09/02/2011  PRIMARY CARE PHYSICIAN: Patient's primary care physician will be in [place], unknown name but appointment for next week.    FINAL DIAGNOSES:  1. Encephalopathy.  2. Klebsiella urinary tract infection with leukocytosis and hematuria.  3. Acute renal failure on chronic kidney disease.  4. Chronic anemia.  5. Presumed ovarian cancer.  6. Hypertension.  7. Dementia.  8. Osteoarthritis.  9. History of congestive heart failure.   MEDICATIONS ON DISCHARGE:  1. Atenolol 25 mg daily.  2. Ativan 0.5 mg 1 tablet every four hours as needed for agitation and anxiety.  3. Ferrous fumarate 324 mg 1 tablet daily.  4. Senna Plus 50/8.6, 2 tablets at bedtime.  5. Vitamin D 1000 international units daily.  6. Tylenol 325 mg twice a day as needed for pain.  7. New medication: Cipro 250 mg, 1 tablet every 12 hours until finished.  8. Stop taking aspirin. Stop taking Lasix. Stop taking losartan. Stop taking Bactrim. Stop taking hydralazine. Stop taking prednisone.   FOLLOW UP: Follow up in 1 to 2 days with hospice. Referral to home physical therapy. Keep medical appointment next week in [place].   DIET: Mechanical soft with thin liquids. Add gravy to chopped and well cut meats. Diet is low sodium diet.   REASON FOR ADMISSION: Patient was admitted 08/30/2011, discharged 09/02/2011. Patient came in with altered mental status and slurred speech.   HISTORY OF PRESENT ILLNESS: 81 year old female with history of dementia, hypertension, chronic kidney disease, osteoporosis, osteoarthritis, presumed ovarian cancer, recently started on Bactrim. She was admitted with encephalopathy and delirium, acute on chronic renal failure, acute on chronic anemia, some dysphagia.   CONSULTATIONS DURING THE HOSPITAL COURSE:  1. Jerilynn SomKatherine Watson from speech pathology.  2. Dr. Mady HaagensenMunsoor Lateef  from nephrology.   LABORATORY, DIAGNOSTIC AND RADIOLOGICAL DATA: White blood cell count 27.5, hemoglobin and hematocrit 8.9 and 26.5, platelet count 184. EKG normal sinus rhythm, no acute ST-T wave changes. Chest x-ray showed findings secondary to changes of fibrosis, similar to prior, small nodular density left upper lung. Cardiac enzymes negative. Glucose 84, BUN 72, creatinine 5.35, sodium 141, potassium 3.7, chloride 109, CO2 18, calcium 7.7. Liver function tests normal. Albumin low at 2.8. GFR on presentation 8. CT scan of the head showed no acute intracranial process. Blood cultures are negative. Magnesium 2.2. Urine culture on 07/08 as outpatient showed Klebsiella pneumonia. Repeat urine culture in the hospital negative. Hemoglobin drifted down with IV fluid hydration down to 7.5 upon discharge, white count down to 15.4, creatinine improved to 3.67, sodium 147, potassium 3.4.   HOSPITAL COURSE PER PROBLEM LIST:  1. For the patient's encephalopathy, most likely secondary to urinary tract infection and worsening kidney function and underlying dementia, this had improved back to baseline as per family. Patient was stable for discharge home.  2. Klebsiella urinary tract infection with leukocytosis. Patient was given Rocephin and this was switched over to p.o. Cipro upon discharge. No Bactrim because this can worsen kidney function. The patient also did have hematuria with this I offered a urology follow up as outpatient. They refused. I recommended stopping aspirin secondary to the anemia and hematuria. Hematuria could also be secondary to history of the ovarian cancer maybe affecting the low organs. They did not want any further work-up for this.  3. Acute renal failure on chronic kidney disease. GFR upon discharge  13, makes this chronic kidney disease stage V. No dialysis as per family. 4. Chronic anemia. They did not want any transfusions during the hospital stay. Hemoglobin of 7.5, does not reach our  threshold anyway. Iron supplementation upon discharge.  5. Hypertension. Blood pressure is on the lower side on presentation. Losartan, Lasix and hydralazine was stopped. Atenolol can be continued upon discharge.  6. Dementia. Not on any medications for this.  7. Osteoarthritis. Can continue Tylenol if needed.  8. History of congestive heart failure. Patient was given IV fluids during the entire hospital course. Will hold off on Lasix, losartan and hydralazine at this point. Can consider starting low dose Lasix as outpatient if needed.   TIME SPENT ON DISCHARGE: 35 minutes.   CODE STATUS: Patient is a DO NOT RESUSCITATE.  ____________________________ Herschell Dimes. Renae Gloss, MD rjw:cms D: 09/02/2011 15:47:21 ET T: 09/02/2011 15:59:08 ET JOB#: [id number]  cc: Herschell Dimes. Renae Gloss, MD, <Dictator> Salley Scarlet MD ELECTRONICALLY SIGNED 09/04/2011 13:15
